# Patient Record
Sex: Female | Born: 2000 | Race: Black or African American | Hispanic: No | Marital: Single | Smoking: Never smoker
Health system: Southern US, Community
[De-identification: ages and names within clinical notes are randomized; demographics above are authoritative.]

## PROBLEM LIST (undated history)

## (undated) ENCOUNTER — Inpatient Hospital Stay (HOSPITAL_COMMUNITY): Payer: Self-pay

## (undated) DIAGNOSIS — Z789 Other specified health status: Secondary | ICD-10-CM

## (undated) DIAGNOSIS — Z202 Contact with and (suspected) exposure to infections with a predominantly sexual mode of transmission: Secondary | ICD-10-CM

## (undated) HISTORY — PX: NO PAST SURGERIES: SHX2092

---
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
## 2019-05-16 ENCOUNTER — Encounter: Payer: Self-pay | Admitting: Advanced Practice Midwife

## 2019-05-16 ENCOUNTER — Other Ambulatory Visit: Payer: Self-pay

## 2019-05-16 ENCOUNTER — Ambulatory Visit (INDEPENDENT_AMBULATORY_CARE_PROVIDER_SITE_OTHER): Payer: 59 | Admitting: Advanced Practice Midwife

## 2019-05-16 VITALS — BP 131/76 | HR 80 | Wt 156.2 lb

## 2019-05-16 DIAGNOSIS — N912 Amenorrhea, unspecified: Secondary | ICD-10-CM | POA: Diagnosis not present

## 2019-05-16 DIAGNOSIS — Z3202 Encounter for pregnancy test, result negative: Secondary | ICD-10-CM | POA: Diagnosis not present

## 2019-05-16 DIAGNOSIS — N911 Secondary amenorrhea: Secondary | ICD-10-CM

## 2019-05-16 LAB — POCT URINE PREGNANCY: Preg Test, Ur: NEGATIVE

## 2019-05-16 NOTE — Progress Notes (Signed)
  GYNECOLOGY PROGRESS NOTE  History:  18 y.o. G2P1002 presents to [facility] office today for problem gyn visit. She reports no menses since 03/28/19.  Menses resumed 4 months ago following vaginal delivery 04/23/2018.  Since return of menses, they were regular, every 28 days until this month.  She took a home pregnancy test that was negative.  She denies pain.  She denies h/a, dizziness, shortness of breath, n/v, or fever/chills.    The following portions of the patient's history were reviewed and updated as appropriate: allergies, current medications, past family history, past medical history, past social history, past surgical history and problem list.  Review of Systems:  Pertinent items are noted in HPI.   Objective:  Physical Exam Blood pressure 131/76, pulse 80, weight 156 lb 3.2 oz (70.9 kg), last menstrual period 03/28/2019. VS reviewed, nursing note reviewed,  Constitutional: well developed, well nourished, no distress HEENT: normocephalic CV: normal rate Pulm/chest wall: normal effort Breast Exam: deferred Abdomen: soft Neuro: alert and oriented x 3 Skin: warm, dry Psych: affect normal Pelvic exam: Deferred  Assessment & Plan:  1. Amenorrhea --Pt with menses 2-3 weeks late.  Prior to this, menses were regular, every 28 days since February when they resumed after childbirth.  --There is no associated abdominal or pelvic pain.  - POCT urine pregnancy.  UPT negative. Pt also reports negative home UPT.  --Discussed options with pt.  One irregular menstrual cycle is likely normal, physiologic, maybe an anovulatory cycle.  Follow up appointment made in 2 months so if period does not return, further work up is needed.  --Pt is sexually active.  Discussed pregnancy prevention if pt does not desire pregnancy.  Discussed LARCs as most effective forms of birth control.  Discussed benefits/risks of other methods.  Pt to take printed materials today and consider options.        Fatima Blank, CNM 5:14 PM

## 2019-05-16 NOTE — Progress Notes (Signed)
Pt is here for amenorrhea. Pt states she has not had a period since 03/28/19. UPT today is negative.

## 2019-07-03 ENCOUNTER — Other Ambulatory Visit: Payer: Self-pay | Admitting: Physician Assistant

## 2019-07-17 ENCOUNTER — Ambulatory Visit: Payer: 59 | Admitting: Advanced Practice Midwife

## 2019-08-17 ENCOUNTER — Other Ambulatory Visit: Payer: Self-pay

## 2019-08-17 ENCOUNTER — Encounter: Payer: Self-pay | Admitting: *Deleted

## 2019-08-17 ENCOUNTER — Encounter: Payer: Self-pay | Admitting: Physician Assistant

## 2019-08-17 ENCOUNTER — Ambulatory Visit (INDEPENDENT_AMBULATORY_CARE_PROVIDER_SITE_OTHER): Payer: 59 | Admitting: Physician Assistant

## 2019-08-17 VITALS — BP 120/80 | HR 70

## 2019-08-17 DIAGNOSIS — G43109 Migraine with aura, not intractable, without status migrainosus: Secondary | ICD-10-CM

## 2019-08-17 MED ORDER — IBUPROFEN 600 MG PO TABS: 600.0000 mg | ORAL_TABLET | Freq: Four times a day (QID) | ORAL | 0 refills | Status: AC | PRN

## 2019-08-17 MED ORDER — AJOVY 225 MG/1.5ML ~~LOC~~ SOAJ: 675.0000 mg | SUBCUTANEOUS | 3 refills | Status: DC

## 2019-08-17 NOTE — Progress Notes (Signed)
History:  Barbara Long is a 18 y.o. G2P1002 who presents to clinic today for headache.  She is still using Emgality but notes that it doesn't work as well the last 2 weeks of the month.   She wants to try something different.  She feels the headaches are still pretty much the same.  She does note they are less severe.  She has more days with headahe than without.  She has been without emgality for the last month.   Excedrin and ibuprofen are helpful.  Sumatriptan was not helpful.   HIT6:54 Number of days in the last 4 weeks with:  Severe headache: 2 Moderate headache: 4 Mild headache: 12  No headache: 10   Past Medical History:  Diagnosis Date  . Chlamydia contact, treated   . Medical history non-contributory     Social History   Socioeconomic History  . Marital status: Single    Spouse name: Not on file  . Number of children: Not on file  . Years of education: Not on file  . Highest education level: Not on file  Occupational History  . Not on file  Social Needs  . Financial resource strain: Not on file  . Food insecurity    Worry: Not on file    Inability: Not on file  . Transportation needs    Medical: Not on file    Non-medical: Not on file  Tobacco Use  . Smoking status: Never Smoker  . Smokeless tobacco: Never Used  Substance and Sexual Activity  . Alcohol use: No  . Drug use: No  . Sexual activity: Yes    Birth control/protection: None  Lifestyle  . Physical activity    Days per week: Not on file    Minutes per session: Not on file  . Stress: Not on file  Relationships  . Social Herbalist on phone: Not on file    Gets together: Not on file    Attends religious service: Not on file    Active member of club or organization: Not on file    Attends meetings of clubs or organizations: Not on file    Relationship status: Not on file  . Intimate partner violence    Fear of current or ex partner: Not on file    Emotionally abused: Not on file   Physically abused: Not on file    Forced sexual activity: Not on file  Other Topics Concern  . Not on file  Social History Narrative  . Not on file    Family History  Problem Relation Age of Onset  . Diabetes Maternal Grandmother   . Hypertension Maternal Grandmother   . Hypertension Mother   . Hypertension Maternal Grandfather   . Diabetes Paternal Grandmother     No Known Allergies  Current Outpatient Medications on File Prior to Visit  Medication Sig Dispense Refill  . Galcanezumab-gnlm (EMGALITY) 120 MG/ML SOAJ Inject 240 mg into the skin as directed AND 120 mg every 30 (thirty) days. Inj 240mg  once then 120mg  monthly. 1 pen 11   No current facility-administered medications on file prior to visit.      Review of Systems:  All pertinent positive/negative included in HPI, all other review of systems are negative   Objective:  Physical Exam BP 120/80   Pulse 70  CONSTITUTIONAL: Well-developed, well-nourished female in no acute distress.  EYES: EOM intact ENT: Normocephalic CARDIOVASCULAR: Regular rate RESPIRATORY: Normal rate.  MUSCULOSKELETAL: Normal ROM SKIN: Warm, dry  without erythema  NEUROLOGICAL: Alert, oriented, CN II-XII grossly intact, Appropriate balance PSYCH: Normal behavior, mood   Assessment & Plan:  Assessment: Migraine - worsened  Plan: Replace Emgality with Ajovy.  She will try 3 ajovy every 3 months.  Sample and savings card provided.  Ibuprofen for migraines that breakthrough I have also given a sample of Ubrelvy.  If she would like a prescription, she will let me know.   Follow-up in 12 months or sooner PRN  Paticia Stack, PA-C 08/17/2019 9:25 AM

## 2019-11-06 ENCOUNTER — Ambulatory Visit: Payer: 59 | Attending: Internal Medicine

## 2019-11-19 ENCOUNTER — Encounter: Payer: Self-pay | Admitting: *Deleted

## 2020-01-15 ENCOUNTER — Encounter: Payer: Self-pay | Admitting: Advanced Practice Midwife

## 2020-01-15 ENCOUNTER — Telehealth (INDEPENDENT_AMBULATORY_CARE_PROVIDER_SITE_OTHER): Payer: 59 | Admitting: Advanced Practice Midwife

## 2020-01-15 DIAGNOSIS — Z3009 Encounter for other general counseling and advice on contraception: Secondary | ICD-10-CM

## 2020-01-15 MED ORDER — MEDROXYPROGESTERONE ACETATE 150 MG/ML IM SUSP: 150.0000 mg | INTRAMUSCULAR | 4 refills | Status: AC

## 2020-01-15 NOTE — Progress Notes (Signed)
    TELEHEALTH GYNECOLOGY VIRTUAL VIDEO VISIT ENCOUNTER NOTE  Provider location: [facility] for [facility] at [place]   I connected with Barbara Long on 01/15/20 at  4:00 PM EDT by MyChart Video Encounter at home and verified that I am speaking with the correct person using two identifiers.   I discussed the limitations, risks, security and privacy concerns of performing an evaluation and management service virtually and the availability of in person appointments. I also discussed with the patient that there may be a patient responsible charge related to this service. The patient expressed understanding and agreed to proceed.   History:  Barbara Long is a 19 y.o. G66P1011 female being evaluated today for contraceptive counseling. She recently had [facility] with [facility] in [facility] and wants to start birth control. She used Depo in the past and wants to start this again.  She denies any gyn problems or concerns.  She denies any abnormal vaginal discharge, bleeding, pelvic pain or other concerns.       Past Medical History:  Diagnosis Date  . Chlamydia contact, treated   . Medical history non-contributory    Past Surgical History:  Procedure Laterality Date  . NO PAST SURGERIES     The following portions of the patient's history were reviewed and updated as appropriate: allergies, current medications, past family history, past medical history, past social history, past surgical history and problem list.   Health Maintenance:  No Pap hx due to young age  Review of Systems:  Pertinent items noted in HPI and remainder of comprehensive ROS otherwise negative.  Physical Exam:   General:  Alert, oriented and cooperative. Patient appears to be in no acute distress.  Mental Status: Normal mood and affect. Normal behavior. Normal judgment and thought content.   Respiratory: Normal respiratory effort, no problems with respiration noted  Rest of physical exam deferred due to  type of encounter  Labs and Imaging No results found for this or any previous visit (from the past 336 hour(s)). No results found.     Assessment and Plan:     1. Encounter for counseling regarding contraception Discussed LARCs as most effective forms of birth control.  Discussed benefits/risks of other methods.  Pt desires Depo but will consider LARC in the future.  Depo ordered as prescription, pt to follow up this week for injection. --Last intercourse 2+ weeks ago, pt encouraged to use abstinence or condoms until her Depo injection is given and pregnancy test will need to be negative at her injection visit. Pt states understanding.  - medroxyPROGESTERone (DEPO-PROVERA) 150 MG/ML injection; Inject 1 mL (150 mg total) into the muscle every 3 (three) months.  Dispense: 1 mL; Refill: 4       I discussed the assessment and treatment plan with the patient. The patient was provided an opportunity to ask questions and all were answered. The patient agreed with the plan and demonstrated an understanding of the instructions.   The patient was advised to call back or seek an in-person evaluation/go to the ED if the symptoms worsen or if the condition fails to improve as anticipated.  I provided 5 minutes of face-to-face time during this encounter.   Fatima Blank, [place] for [facility], [place]

## 2020-01-15 NOTE — Progress Notes (Signed)
Virtual Visit via Telephone Note  I connected with Barbara Long on 01/15/20 at  4:00 PM EDT by telephone and verified that I am speaking with the correct person using two identifiers.  Pt requests to start Depo. Denies unprotected IC x 14 days

## 2020-01-17 ENCOUNTER — Other Ambulatory Visit: Payer: Self-pay

## 2020-01-17 ENCOUNTER — Ambulatory Visit (INDEPENDENT_AMBULATORY_CARE_PROVIDER_SITE_OTHER): Payer: 59

## 2020-01-17 DIAGNOSIS — Z3042 Encounter for surveillance of injectable contraceptive: Secondary | ICD-10-CM

## 2020-01-17 DIAGNOSIS — Z3202 Encounter for pregnancy test, result negative: Secondary | ICD-10-CM | POA: Diagnosis not present

## 2020-01-17 LAB — POCT URINE PREGNANCY: Preg Test, Ur: NEGATIVE

## 2020-01-17 MED ORDER — MEDROXYPROGESTERONE ACETATE 150 MG/ML IM SUSP
150.0000 mg | Freq: Once | INTRAMUSCULAR | Status: AC
  Administered 2020-01-17: 150 mg via INTRAMUSCULAR

## 2020-01-17 NOTE — Progress Notes (Signed)
Pt presents for pregnancy test and depo inj. Patient states that she had an abortion 2/9, but still had a faint positive on 3/9. Test today is negative. Per Lattie Haw okay to give depo today with negative test. Patient states that she has not had IC since 2 weeks prior to 2/9. [facility] and was told that there was nothing left in her uterus.  Depo inj given in RUOQ. Patient tolerated well. Next depo due 6/3-6/17.

## 2020-04-09 ENCOUNTER — Other Ambulatory Visit: Payer: Self-pay

## 2020-04-09 ENCOUNTER — Ambulatory Visit: Payer: 59

## 2020-04-09 MED ORDER — MEDROXYPROGESTERONE ACETATE 150 MG/ML IM SUSP: 150.0000 mg | Freq: Once | INTRAMUSCULAR | Status: DC

## 2020-04-09 NOTE — Progress Notes (Unsigned)
Barbara Long did not have depo. Appointment rescheduled. -EH/RMA

## 2020-04-16 ENCOUNTER — Ambulatory Visit (INDEPENDENT_AMBULATORY_CARE_PROVIDER_SITE_OTHER): Payer: 59

## 2020-04-16 ENCOUNTER — Other Ambulatory Visit: Payer: Self-pay

## 2020-04-16 VITALS — BP 124/69 | HR 88 | Wt 144.7 lb

## 2020-04-16 DIAGNOSIS — Z3042 Encounter for surveillance of injectable contraceptive: Secondary | ICD-10-CM | POA: Diagnosis not present

## 2020-04-16 MED ORDER — MEDROXYPROGESTERONE ACETATE 150 MG/ML IM SUSP
150.0000 mg | Freq: Once | INTRAMUSCULAR | Status: AC
  Administered 2020-04-16: 150 mg via INTRAMUSCULAR

## 2020-04-16 NOTE — Progress Notes (Signed)
GYN presents for DEPO, given in [place], tolerated well.  Next DEPO Sept.01-15, 2021  Administrations This Visit    medroxyPROGESTERone (DEPO-PROVERA) injection 150 mg    Admin Date 04/16/2020 Action Given Dose 150 mg Route Intramuscular Administered By Tamela Oddi, RMA

## 2020-05-21 NOTE — Progress Notes (Signed)
Patient ID: Barbara Long, female   DOB: 2001/10/19, 20 y.o.   MRN: [id number] Patient was assessed and managed by nursing staff during this encounter. I have reviewed the chart and agree with the documentation and plan. I have also made any necessary editorial changes.  Emeterio Reeve, MD 05/21/2020 1:12 PM

## 2020-06-02 ENCOUNTER — Other Ambulatory Visit: Payer: Self-pay | Admitting: Physician Assistant

## 2020-07-08 ENCOUNTER — Ambulatory Visit: Payer: 59

## 2020-07-10 ENCOUNTER — Telehealth: Payer: Self-pay | Admitting: Radiology

## 2020-07-10 NOTE — Telephone Encounter (Signed)
Left message on voicemail for patient to call [id number] to schedule a f/u headache appointment with [name], PA in October.

## 2020-07-11 ENCOUNTER — Other Ambulatory Visit: Payer: Self-pay

## 2020-07-11 ENCOUNTER — Ambulatory Visit (INDEPENDENT_AMBULATORY_CARE_PROVIDER_SITE_OTHER): Payer: 59

## 2020-07-11 ENCOUNTER — Encounter: Payer: Self-pay | Admitting: Radiology

## 2020-07-11 VITALS — BP 123/73 | HR 94 | Wt 154.0 lb

## 2020-07-11 DIAGNOSIS — Z3042 Encounter for surveillance of injectable contraceptive: Secondary | ICD-10-CM | POA: Diagnosis not present

## 2020-07-11 MED ORDER — MEDROXYPROGESTERONE ACETATE 150 MG/ML IM SUSP
150.0000 mg | Freq: Once | INTRAMUSCULAR | Status: AC
  Administered 2020-07-11: 150 mg via INTRAMUSCULAR

## 2020-07-11 NOTE — Progress Notes (Signed)
GYN presents for DEPO Injection, given in RUOQ, tolerated well.  Next DEPO Nov. 26 Dec. 10, 2021  Administrations This Visit    medroxyPROGESTERone (DEPO-PROVERA) injection 150 mg    Admin Date 07/11/2020 Action Given Dose 150 mg Route Intramuscular Administered By Tamela Oddi, RMA

## 2020-07-14 NOTE — Progress Notes (Signed)
I have reviewed the chart and agree with the nurse's note and plan of care for this visit.   Fatima Blank, CNM 9:23 AM

## 2020-08-22 ENCOUNTER — Ambulatory Visit (INDEPENDENT_AMBULATORY_CARE_PROVIDER_SITE_OTHER): Payer: 59 | Admitting: Physician Assistant

## 2020-08-22 ENCOUNTER — Other Ambulatory Visit: Payer: Self-pay

## 2020-08-22 ENCOUNTER — Encounter: Payer: Self-pay | Admitting: Physician Assistant

## 2020-08-22 VITALS — BP 120/77 | HR 71 | Ht 65.0 in | Wt 145.8 lb

## 2020-08-22 DIAGNOSIS — G43009 Migraine without aura, not intractable, without status migrainosus: Secondary | ICD-10-CM

## 2020-08-22 MED ORDER — UBRELVY 100 MG PO TABS: 100.0000 mg | ORAL_TABLET | ORAL | 11 refills | Status: AC | PRN

## 2020-08-22 MED ORDER — AJOVY 225 MG/1.5ML ~~LOC~~ SOAJ: 675.0000 mg | SUBCUTANEOUS | 3 refills | Status: AC

## 2020-08-22 NOTE — Progress Notes (Signed)
History:  Barbara Long is a 19 y.o. G2P1011 who presents to clinic today for yearly headache eval. She notes the ajovy has been helpful for prevention of migraine and wants to continue.  She did not initially remember what happened with her use of ubrelvy but later seemed to believe it worked well. She is on a good routine now being in school.  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  She has 2 kids, ages 29 and 19, that she takes to daycare.  She has another job on the weekends for 8 hours during each day.  She works as a Quarry manager and hopes to be admitted to the [facility] program [facility] in February.  She does not exercise.    HIT6:54 Number of days in the last 4 weeks with:  Severe headache: 3 Moderate headache: 11 Mild headache: 6  No headache: 8   Past Medical History:  Diagnosis Date  . Chlamydia contact, treated   . Medical history non-contributory     Social History   Socioeconomic History  . Marital status: Single    Spouse name: Not on file  . Number of children: Not on file  . Years of education: Not on file  . Highest education level: Not on file  Occupational History  . Not on file  Tobacco Use  . Smoking status: Never Smoker  . Smokeless tobacco: Never Used  Substance and Sexual Activity  . Alcohol use: No  . Drug use: No  . Sexual activity: Yes    Birth control/protection: None  Other Topics Concern  . Not on file  Social History Narrative  . Not on file   Social Determinants of Health   Financial Resource Strain:   . Difficulty of Paying Living Expenses: Not on file  Food Insecurity:   . Worried About Charity fundraiser in the Last Year: Not on file  . Ran Out of Food in the Last Year: Not on file  Transportation Needs:   . Lack of Transportation (Medical): Not on file  . Lack of Transportation (Non-Medical): Not on file  Physical Activity:   . Days of Exercise per Week: Not on file  . Minutes of Exercise per Session:  Not on file  Stress:   . Feeling of Stress : Not on file  Social Connections:   . Frequency of Communication with Friends and Family: Not on file  . Frequency of Social Gatherings with Friends and Family: Not on file  . Attends Religious Services: Not on file  . Active Member of Clubs or Organizations: Not on file  . Attends Archivist Meetings: Not on file  . Marital Status: Not on file  Intimate Partner Violence:   . Fear of Current or Ex-Partner: Not on file  . Emotionally Abused: Not on file  . Physically Abused: Not on file  . Sexually Abused: Not on file    Family History  Problem Relation Age of Onset  . Diabetes Maternal Grandmother   . Hypertension Maternal Grandmother   . Hypertension Mother   . Hypertension Maternal Grandfather   . Diabetes Paternal Grandmother     No Known Allergies  Current Outpatient Medications on File Prior to Visit  Medication Sig Dispense Refill  . AJOVY 225 MG/1.5ML SOAJ INJECT 675MG  INTO THE SKIN EVERY 3 MONTHS 1.5 mL 2  . ibuprofen (ADVIL) 600 MG tablet Take 1 tablet (600 mg total) by mouth  every 6 (six) hours as needed. 30 tablet 0  . medroxyPROGESTERone (DEPO-PROVERA) 150 MG/ML injection Inject 1 mL (150 mg total) into the muscle every 3 (three) months. 1 mL 4   No current facility-administered medications on file prior to visit.     Review of Systems:  All pertinent positive/negative included in HPI, all other review of systems are negative   Objective:  Physical Exam BP 120/77   Pulse 71   Ht 5\' 5"  (1.651 m)   Wt 145 lb 12.8 oz (66.1 kg)   BMI 24.26 kg/m  CONSTITUTIONAL: Well-developed, well-nourished female in no acute distress.  EYES: EOM intact ENT: Normocephalic CARDIOVASCULAR: Regular rate  RESPIRATORY: Normal rate. MUSCULOSKELETAL: Normal ROM SKIN: Warm, dry without erythema  NEUROLOGICAL: Alert, oriented, CN II-XII grossly intact, Appropriate balance PSYCH: Normal behavior, mood   Assessment & Plan:   Assessment: 1. Migraine without aura and without status migrainosus, not intractable    worsened  Plan: Pt advised likely migraines are worse due to her crazy schedule.  She sleeps at varying times for varying amounts of time as she works overnight and has 2 children.  She is encouraged to consider her schedule and arrange more regular sleep as able.  Will continue ajovy for prevention.  She prefers to use 3 injections every 3 months.  I have prescribed it this way for her.   Roselyn Meier for acute migraine.  Hydration encouraged! Eat at least 3 times per day or more often as able.  Exercise whenever possible.  Use this as a means of more energy and limit caffeine consumption.  Follow-up in 12 months or sooner PRN  Paticia Stack, PA-C 08/22/2020 10:06 AM

## 2020-08-22 NOTE — Patient Instructions (Signed)

## 2020-09-08 ENCOUNTER — Encounter: Payer: Self-pay | Admitting: *Deleted

## 2020-09-17 ENCOUNTER — Encounter: Payer: Self-pay | Admitting: *Deleted

## 2020-09-30 ENCOUNTER — Ambulatory Visit: Payer: 59

## 2020-10-28 ENCOUNTER — Ambulatory Visit: Payer: Medicaid Other

## 2020-10-28 ENCOUNTER — Other Ambulatory Visit: Payer: Medicaid Other

## 2020-10-28 DIAGNOSIS — Z20822 Contact with and (suspected) exposure to covid-19: Secondary | ICD-10-CM | POA: Diagnosis not present

## 2020-10-29 LAB — SARS-COV-2, NAA 2 DAY TAT

## 2020-10-29 LAB — SPECIMEN STATUS REPORT

## 2020-10-29 LAB — NOVEL CORONAVIRUS, NAA: SARS-CoV-2, NAA: DETECTED — AB

## 2020-11-20 ENCOUNTER — Ambulatory Visit: Payer: Medicaid Other

## 2020-11-21 ENCOUNTER — Other Ambulatory Visit: Payer: Self-pay

## 2020-11-21 ENCOUNTER — Other Ambulatory Visit (HOSPITAL_COMMUNITY)
Admission: RE | Admit: 2020-11-21 | Discharge: 2020-11-21 | Disposition: A | Discharge status: Home / Self Care | Payer: Medicaid Other | Source: Ambulatory Visit | Attending: Obstetrics | Admitting: Obstetrics

## 2020-11-21 ENCOUNTER — Ambulatory Visit: Payer: Medicaid Other

## 2020-11-21 DIAGNOSIS — N898 Other specified noninflammatory disorders of vagina: Secondary | ICD-10-CM

## 2020-11-21 NOTE — Progress Notes (Signed)
Pt is in the office for std testing, reports vaginal discharge

## 2020-11-24 LAB — CERVICOVAGINAL ANCILLARY ONLY
Bacterial Vaginitis (gardnerella): NEGATIVE
Candida Glabrata: NEGATIVE
Candida Vaginitis: POSITIVE — AB
Chlamydia: NEGATIVE
Comment: NEGATIVE
Comment: NEGATIVE
Comment: NEGATIVE
Comment: NEGATIVE
Comment: NEGATIVE
Comment: NORMAL
Neisseria Gonorrhea: NEGATIVE
Trichomonas: NEGATIVE

## 2020-11-28 ENCOUNTER — Other Ambulatory Visit: Payer: Self-pay | Admitting: Obstetrics & Gynecology

## 2020-11-28 MED ORDER — FLUCONAZOLE 150 MG PO TABS: 150.0000 mg | ORAL_TABLET | Freq: Once | ORAL | 0 refills | Status: DC

## 2020-11-28 NOTE — Addendum Note (Signed)
Addended by: Woodroe Mode on: 11/28/2020 02:09 PM   Modules accepted: Orders

## 2020-12-11 ENCOUNTER — Telehealth (INDEPENDENT_AMBULATORY_CARE_PROVIDER_SITE_OTHER): Payer: Medicaid Other | Admitting: Internal Medicine

## 2020-12-17 NOTE — Progress Notes (Signed)
Virtual encounter not able to be completed---patient did not answer phone call.   Phill Myron, D.O. Primary Care at [facility]  12/17/2020, 12:10 PM

## 2021-01-13 ENCOUNTER — Ambulatory Visit (INDEPENDENT_AMBULATORY_CARE_PROVIDER_SITE_OTHER): Payer: Medicaid Other | Admitting: Obstetrics and Gynecology

## 2021-01-13 ENCOUNTER — Other Ambulatory Visit: Payer: Self-pay

## 2021-01-13 ENCOUNTER — Other Ambulatory Visit (HOSPITAL_COMMUNITY)
Admission: RE | Admit: 2021-01-13 | Discharge: 2021-01-13 | Disposition: A | Discharge status: Home / Self Care | Payer: BC Managed Care – PPO | Source: Ambulatory Visit | Attending: Obstetrics and Gynecology | Admitting: Obstetrics and Gynecology

## 2021-01-13 ENCOUNTER — Encounter: Payer: Self-pay | Admitting: Obstetrics and Gynecology

## 2021-01-13 VITALS — BP 117/73 | HR 75 | Ht 65.0 in | Wt 157.0 lb

## 2021-01-13 DIAGNOSIS — N898 Other specified noninflammatory disorders of vagina: Secondary | ICD-10-CM | POA: Diagnosis not present

## 2021-01-13 DIAGNOSIS — Z113 Encounter for screening for infections with a predominantly sexual mode of transmission: Secondary | ICD-10-CM | POA: Insufficient documentation

## 2021-01-13 DIAGNOSIS — B373 Candidiasis of vulva and vagina: Secondary | ICD-10-CM | POA: Diagnosis not present

## 2021-01-13 MED ORDER — FLUCONAZOLE 150 MG PO TABS: 150.0000 mg | ORAL_TABLET | Freq: Once | ORAL | 1 refills | Status: AC

## 2021-01-13 NOTE — Progress Notes (Signed)
   History:  Ms. Karen Huhta is a 20 y.o. G2P1011 who presents to clinic today for vaginal discharge.    Reports that she had similar issue in January and was treated for yeast infection with subsequent improvement. Reports several days of vaginal discharge, itching, irritation. No abnormal odor. No new sexual partners. Denies douching. No pelvic pain or fevers.   Also reports that she is on depo and does not have period and wondering if there is way to get period again. Otherwise happy with depo. Had IUD in past and it fell out.   The following portions of the patient's history were reviewed and updated as appropriate: allergies, current medications, family history, past medical history, social history, past surgical history and problem list.  Review of Systems:  Review of Systems  Constitutional: Negative for chills and fever.  Genitourinary: Negative for dysuria, flank pain, frequency and urgency.  Musculoskeletal: Negative for myalgias.      Objective:  Physical Exam There were no vitals taken for this visit. Physical Exam Vitals reviewed.  Constitutional:      Appearance: Normal appearance.  Pulmonary:     Effort: Pulmonary effort is normal.  Abdominal:     General: Bowel sounds are normal.  Genitourinary:    General: Normal vulva.     Vagina: Vaginal discharge present.     Comments: White discharge, no foul odor  Neurological:     Mental Status: She is alert.       Labs and Imaging No results found for this or any previous visit (from the past 24 hour(s)).  No results found.   Assessment & Plan:   Vaginal Discharge -cervicovag swab obtained -will treat w diflucan, discussed return precautions w patient, discussed other preventative measures    Janet Berlin, MD 01/13/2021 8:31 AM    Sharene Skeans, MD [facility] Family Medicine Fellow, [facility] for [facility], [place]

## 2021-01-13 NOTE — Progress Notes (Signed)
Pt presents today for evaluation of vaginal d/c that is milky colored with itching. Denies any burning or odor. States this has been going on for about a week. Denies any urinary symptoms.

## 2021-01-14 ENCOUNTER — Ambulatory Visit
Admission: EM | Admit: 2021-01-14 | Discharge: 2021-01-14 | Disposition: A | Discharge status: Home / Self Care | Payer: BC Managed Care – PPO | Attending: Family Medicine | Admitting: Family Medicine

## 2021-01-14 ENCOUNTER — Other Ambulatory Visit: Payer: Self-pay

## 2021-01-14 DIAGNOSIS — J069 Acute upper respiratory infection, unspecified: Secondary | ICD-10-CM

## 2021-01-14 LAB — CERVICOVAGINAL ANCILLARY ONLY
Bacterial Vaginitis (gardnerella): NEGATIVE
Candida Glabrata: NEGATIVE
Candida Vaginitis: POSITIVE — AB
Chlamydia: NEGATIVE
Comment: NEGATIVE
Comment: NEGATIVE
Comment: NEGATIVE
Comment: NEGATIVE
Comment: NEGATIVE
Comment: NORMAL
Neisseria Gonorrhea: NEGATIVE
Trichomonas: NEGATIVE

## 2021-01-14 MED ORDER — PROMETHAZINE-DM 6.25-15 MG/5ML PO SYRP: 5.0000 mL | ORAL_SOLUTION | Freq: Four times a day (QID) | ORAL | 0 refills | Status: AC | PRN

## 2021-01-14 MED ORDER — FLUTICASONE PROPIONATE 50 MCG/ACT NA SUSP: 1.0000 | Freq: Two times a day (BID) | NASAL | 0 refills | Status: AC

## 2021-01-14 NOTE — ED Provider Notes (Signed)
[facility]    CSN: [id number] Arrival date & time: 01/14/21  0017      History   Chief Complaint Chief Complaint  Patient presents with  . Sore Throat  . Cough    HPI Barbara Long is a 20 y.o. female.   Patient presenting today with 6-day history of sore throat, cough, congestion, headache.  She denies fever, chills, body aches, chest pain, shortness of breath, abdominal pain, nausea vomiting diarrhea.  She is taking TheraFlu with mild temporary relief.  She states both of her children are sick with similar symptoms.  No known urgent chronic medical problems.       Past Medical History:  Diagnosis Date  . Chlamydia contact, treated   . Medical history non-contributory     Patient Active Problem List   Diagnosis Date Noted  . Migraine without aura and without status migrainosus, not intractable 08/22/2020  . 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 NO PAST SURGERIES      OB History    Gravida  2   Para  1   Term  1   Preterm  0   AB  1   Living  1     SAB  0   IAB  1   Ectopic  0   Multiple  0   Live Births  1            Home Medications    Prior to Admission medications   Medication Sig Start Date End Date Taking? Authorizing Provider  fluticasone (FLONASE) 50 MCG/ACT nasal spray Place 1 spray into both nostrils in the morning and at bedtime. 01/14/21  Yes Volney American, PA-C  promethazine-dextromethorphan (PROMETHAZINE-DM) 6.25-15 MG/5ML syrup Take 5 mLs by mouth 4 (four) times daily as needed for cough. 01/14/21  Yes Volney American, PA-C  Fremanezumab-vfrm (AJOVY) 225 MG/1.5ML SOAJ Inject 675 mg into the skin every 3 (three) months. 08/22/20   Jaclyn Prime, Collene Leyden, PA-C  ibuprofen (ADVIL) 600 MG tablet Take 1 tablet (600 mg total) by mouth every 6 (six) hours as needed. 08/17/19   Jaclyn Prime, Collene Leyden, PA-C  medroxyPROGESTERone (DEPO-PROVERA) 150 MG/ML  injection Inject 1 mL (150 mg total) into the muscle every 3 (three) months. 01/15/20   Leftwich-Kirby, Kathie Dike, CNM  Ubrogepant (UBRELVY) 100 MG TABS Take 100 mg by mouth as needed. 08/22/20   Paticia Stack, PA-C    Family History Family History  Problem Relation Age of Onset  . Diabetes Maternal Grandmother   . Hypertension Maternal Grandmother   . Hypertension Mother   . Hypertension Maternal Grandfather   . Diabetes Paternal Grandmother     Social History Social History   Tobacco Use  . Smoking status: Never Smoker  . Smokeless tobacco: Never Used  Substance Use Topics  . Alcohol use: No  . Drug use: No     Allergies   Patient has no known allergies.   Review of Systems Review of Systems HPI  Physical Exam Triage Vital Signs ED Triage Vitals  Enc Vitals Group     BP 01/14/21 1003 117/67     Pulse Rate 01/14/21 1003 69     Resp 01/14/21 1003 16     Temp 01/14/21 1003 97.8 F (36.6 C)     Temp Source 01/14/21 1003 Oral     SpO2 01/14/21 1003 98 %     Weight --  Height --      Head Circumference --      Peak Flow --      Pain Score 01/14/21 1006 0     Pain Loc --      Pain Edu? --      Excl. in [place]? --    No data found.  Updated Vital Signs BP 117/67 (BP Location: Left Arm)   Pulse 69   Temp 97.8 F (36.6 C) (Oral)   Resp 16   LMP  (LMP Unknown)   SpO2 98%   Visual Acuity Right Eye Distance:   Left Eye Distance:   Bilateral Distance:    Right Eye Near:   Left Eye Near:    Bilateral Near:     Physical Exam Vitals and nursing note reviewed.  Constitutional:      Appearance: Normal appearance. She is not ill-appearing.  HENT:     Head: Atraumatic.     Right Ear: Tympanic membrane normal.     Left Ear: Tympanic membrane normal.     Nose: Rhinorrhea present.     Mouth/Throat:     Mouth: Mucous membranes are moist.     Pharynx: Posterior oropharyngeal erythema present. No oropharyngeal exudate.  Eyes:     Extraocular  Movements: Extraocular movements intact.     Conjunctiva/sclera: Conjunctivae normal.  Cardiovascular:     Rate and Rhythm: Normal rate and regular rhythm.     Heart sounds: Normal heart sounds.  Pulmonary:     Effort: Pulmonary effort is normal. No respiratory distress.     Breath sounds: Normal breath sounds. No wheezing or rales.  Musculoskeletal:        General: Normal range of motion.     Cervical back: Normal range of motion and neck supple.  Skin:    General: Skin is warm and dry.  Neurological:     Mental Status: She is alert and oriented to person, place, and time.  Psychiatric:        Mood and Affect: Mood normal.        Thought Content: Thought content normal.        Judgment: Judgment normal.      UC Treatments / Results  Labs (all labs ordered are listed, but only abnormal results are displayed) Labs Reviewed - No data to display  EKG   Radiology No results found.  Procedures Procedures (including critical care time)  Medications Ordered in UC Medications - No data to display  Initial Impression / Assessment and Plan / UC Course  I have reviewed the triage vital signs and the nursing notes.  Pertinent labs & imaging results that were available during my care of the patient were reviewed by me and considered in my medical decision making (see chart for details).     Vitals and exam reassuring today, declines Covid or flu testing today.  We will treat with Phenergan DM cough syrup, Flonase nasal spray, other over-the-counter supportive care.  Return precautions reviewed.  Quarantine until symptoms resolve.  Final Clinical Impressions(s) / UC Diagnoses   Final diagnoses:  Viral URI with cough   Discharge Instructions   None    ED Prescriptions    Medication Sig Dispense Auth. Provider   promethazine-dextromethorphan (PROMETHAZINE-DM) 6.25-15 MG/5ML syrup Take 5 mLs by mouth 4 (four) times daily as needed for cough. 100 mL Volney American,  PA-C   fluticasone [facility]) 50 MCG/ACT nasal spray Place 1 spray into both nostrils in the morning and at  bedtime. 16 g Volney American, [place]     PDMP not reviewed this encounter.   Volney American, [place] 01/14/21 1901

## 2021-01-14 NOTE — ED Triage Notes (Signed)
Patient presents to Urgent Care with complaints of sore throat and cough since Friday. Treating symptoms with Theraflu.  Denies fever.

## 2021-09-02 ENCOUNTER — Encounter: Payer: Self-pay | Admitting: *Deleted

## 2021-11-20 DIAGNOSIS — Z124 Encounter for screening for malignant neoplasm of cervix: Secondary | ICD-10-CM | POA: Diagnosis not present

## 2021-11-20 DIAGNOSIS — L309 Dermatitis, unspecified: Secondary | ICD-10-CM | POA: Diagnosis not present

## 2021-11-20 DIAGNOSIS — Z202 Contact with and (suspected) exposure to infections with a predominantly sexual mode of transmission: Secondary | ICD-10-CM | POA: Diagnosis not present

## 2021-11-20 DIAGNOSIS — N898 Other specified noninflammatory disorders of vagina: Secondary | ICD-10-CM | POA: Diagnosis not present

## 2021-12-11 DIAGNOSIS — Z3490 Encounter for supervision of normal pregnancy, unspecified, unspecified trimester: Secondary | ICD-10-CM | POA: Diagnosis not present

## 2021-12-11 DIAGNOSIS — R87610 Atypical squamous cells of undetermined significance on cytologic smear of cervix (ASC-US): Secondary | ICD-10-CM | POA: Diagnosis not present

## 2021-12-23 DIAGNOSIS — Z3A15 15 weeks gestation of pregnancy: Secondary | ICD-10-CM | POA: Diagnosis not present

## 2021-12-23 DIAGNOSIS — Z3687 Encounter for antenatal screening for uncertain dates: Secondary | ICD-10-CM | POA: Diagnosis not present

## 2021-12-23 DIAGNOSIS — Z36 Encounter for antenatal screening for chromosomal anomalies: Secondary | ICD-10-CM | POA: Diagnosis not present

## 2021-12-23 DIAGNOSIS — Z3482 Encounter for supervision of other normal pregnancy, second trimester: Secondary | ICD-10-CM | POA: Diagnosis not present

## 2022-08-23 ENCOUNTER — Encounter: Payer: Self-pay | Admitting: *Deleted

## 2022-10-05 DIAGNOSIS — R079 Chest pain, unspecified: Secondary | ICD-10-CM | POA: Diagnosis not present

## 2022-12-31 DIAGNOSIS — R87618 Other abnormal cytological findings on specimens from cervix uteri: Secondary | ICD-10-CM | POA: Diagnosis not present

## 2022-12-31 DIAGNOSIS — G478 Other sleep disorders: Secondary | ICD-10-CM | POA: Diagnosis not present

## 2022-12-31 DIAGNOSIS — Z72820 Sleep deprivation: Secondary | ICD-10-CM | POA: Diagnosis not present

## 2022-12-31 DIAGNOSIS — R5383 Other fatigue: Secondary | ICD-10-CM | POA: Diagnosis not present

## 2023-02-07 DIAGNOSIS — A749 Chlamydial infection, unspecified: Secondary | ICD-10-CM | POA: Diagnosis not present

## 2023-02-07 DIAGNOSIS — B3731 Acute candidiasis of vulva and vagina: Secondary | ICD-10-CM | POA: Diagnosis not present

## 2023-05-24 DIAGNOSIS — R197 Diarrhea, unspecified: Secondary | ICD-10-CM | POA: Diagnosis not present

## 2023-06-13 DIAGNOSIS — Z020 Encounter for examination for admission to educational institution: Secondary | ICD-10-CM | POA: Diagnosis not present

## 2023-07-21 DIAGNOSIS — G43009 Migraine without aura, not intractable, without status migrainosus: Secondary | ICD-10-CM | POA: Diagnosis not present

## 2023-07-21 DIAGNOSIS — Z23 Encounter for immunization: Secondary | ICD-10-CM | POA: Diagnosis not present

## 2023-08-30 DIAGNOSIS — Z111 Encounter for screening for respiratory tuberculosis: Secondary | ICD-10-CM | POA: Diagnosis not present

## 2023-08-30 DIAGNOSIS — Z23 Encounter for immunization: Secondary | ICD-10-CM | POA: Diagnosis not present

## 2023-08-30 DIAGNOSIS — G43009 Migraine without aura, not intractable, without status migrainosus: Secondary | ICD-10-CM | POA: Diagnosis not present

## 2023-09-18 ENCOUNTER — Emergency Department (HOSPITAL_COMMUNITY): Payer: PRIVATE HEALTH INSURANCE

## 2023-09-18 ENCOUNTER — Emergency Department (HOSPITAL_COMMUNITY)
Admission: EM | Admit: 2023-09-18 | Discharge: 2023-09-18 | Disposition: A | Discharge status: Home / Self Care | Payer: PRIVATE HEALTH INSURANCE | Attending: Emergency Medicine | Admitting: Emergency Medicine

## 2023-09-18 ENCOUNTER — Encounter (HOSPITAL_COMMUNITY): Payer: Self-pay

## 2023-09-18 ENCOUNTER — Other Ambulatory Visit: Payer: Self-pay

## 2023-09-18 DIAGNOSIS — O209 Hemorrhage in early pregnancy, unspecified: Secondary | ICD-10-CM | POA: Diagnosis present

## 2023-09-18 DIAGNOSIS — Z3A01 Less than 8 weeks gestation of pregnancy: Secondary | ICD-10-CM | POA: Diagnosis not present

## 2023-09-18 DIAGNOSIS — O4690 Antepartum hemorrhage, unspecified, unspecified trimester: Secondary | ICD-10-CM

## 2023-09-18 LAB — CBC WITH DIFFERENTIAL/PLATELET
Abs Immature Granulocytes: 0.02 10*3/uL (ref 0.00–0.07)
Basophils Absolute: 0 10*3/uL (ref 0.0–0.1)
Basophils Relative: 1 %
Eosinophils Absolute: 0.2 10*3/uL (ref 0.0–0.5)
Eosinophils Relative: 2 %
HCT: 38.4 % (ref 36.0–46.0)
Hemoglobin: 11.8 g/dL — ABNORMAL LOW (ref 12.0–15.0)
Immature Granulocytes: 0 %
Lymphocytes Relative: 20 %
Lymphs Abs: 1.5 10*3/uL (ref 0.7–4.0)
MCH: 27.1 pg (ref 26.0–34.0)
MCHC: 30.7 g/dL (ref 30.0–36.0)
MCV: 88.3 fL (ref 80.0–100.0)
Monocytes Absolute: 0.5 10*3/uL (ref 0.1–1.0)
Monocytes Relative: 7 %
Neutro Abs: 5.1 10*3/uL (ref 1.7–7.7)
Neutrophils Relative %: 70 %
Platelets: 222 10*3/uL (ref 150–400)
RBC: 4.35 MIL/uL (ref 3.87–5.11)
RDW: 14.6 % (ref 11.5–15.5)
WBC: 7.3 10*3/uL (ref 4.0–10.5)
nRBC: 0 % (ref 0.0–0.2)

## 2023-09-18 LAB — HCG, QUANTITATIVE, PREGNANCY: hCG, Beta Chain, Quant, S: 1034 m[IU]/mL — ABNORMAL HIGH (ref <5)

## 2023-09-18 MED ORDER — CEPHALEXIN 500 MG PO CAPS: 500.0000 mg | ORAL_CAPSULE | Freq: Two times a day (BID) | ORAL | 0 refills | Status: DC

## 2023-09-18 NOTE — ED Provider Notes (Signed)
[place] EMERGENCY DEPARTMENT AT [facility] Provider Note   CSN: [id number] Arrival date & time: 09/18/23  0036     History  Chief Complaint  Patient presents with   Vaginal Bleeding    Barbara Long is a 22 y.o. female.  22 year old female presents the emergency room with concern for vaginal bleeding in early pregnancy.  Reports positive home pregnancy test this week, noted vaginal bleeding while eating dinner.  This pregnancy would make patient G5 P2 (ab x 2). Lower abdominal cramping, mild. No other complaints or concerns. Has used 1 pad tonight.        Home Medications Prior to Admission medications   Medication Sig Start Date End Date Taking? Authorizing Provider  fluticasone (FLONASE) 50 MCG/ACT nasal spray Place 1 spray into both nostrils in the morning and at bedtime. Patient not taking: Reported on 09/18/2023 01/14/21   Particia Nearing, PA-C  Fremanezumab-vfrm (AJOVY) 225 MG/1.5ML SOAJ Inject 675 mg into the skin every 3 (three) months. Patient not taking: Reported on 09/18/2023 08/22/20   Glyn Ade, Scot Jun, PA-C  ibuprofen (ADVIL) 600 MG tablet Take 1 tablet (600 mg total) by mouth every 6 (six) hours as needed. Patient not taking: Reported on 09/18/2023 08/17/19   Glyn Ade, Scot Jun, PA-C  medroxyPROGESTERone (DEPO-PROVERA) 150 MG/ML injection Inject 1 mL (150 mg total) into the muscle every 3 (three) months. Patient not taking: Reported on 09/18/2023 01/15/20   Hurshel Party, CNM  promethazine-dextromethorphan (PROMETHAZINE-DM) 6.25-15 MG/5ML syrup Take 5 mLs by mouth 4 (four) times daily as needed for cough. Patient not taking: Reported on 09/18/2023 01/14/21   Particia Nearing, PA-C  propranolol (INDERAL) 40 MG tablet Take 40 mg by mouth at bedtime. Patient not taking: Reported on 09/18/2023 08/30/23 02/26/24  [provider]  SUMAtriptan (IMITREX) 50 MG tablet Take 50 mg by mouth every 2 (two) hours as  needed. Patient not taking: Reported on 09/18/2023    [provider]  Ubrogepant (UBRELVY) 100 MG TABS Take 100 mg by mouth as needed. Patient not taking: Reported on 09/18/2023 08/22/20   Glyn Ade, Scot Jun, PA-C      Allergies    Patient has no known allergies.    Review of Systems   Review of Systems Negative except as per HPI Physical Exam Updated Vital Signs BP (!) 151/93 (BP Location: Right Arm)   Pulse 83   Temp 97.7 F (36.5 C) (Oral)   Resp 18   Ht 5\' 5"  (1.651 m)   Wt 81.2 kg   SpO2 99%   BMI 29.79 kg/m  Physical Exam Vitals and nursing note reviewed.  Constitutional:      General: She is not in acute distress.    Appearance: She is well-developed. She is not diaphoretic.  HENT:     Head: Normocephalic and atraumatic.  Pulmonary:     Effort: Pulmonary effort is normal.  Abdominal:     Palpations: Abdomen is soft.     Tenderness: There is no abdominal tenderness.  Skin:    General: Skin is warm and dry.     Findings: No erythema or rash.  Neurological:     Mental Status: She is alert and oriented to person, place, and time.  Psychiatric:        Behavior: Behavior normal.     ED Results / Procedures / Treatments   Labs (all labs ordered are listed, but only abnormal results are displayed) Labs Reviewed  CBC WITH DIFFERENTIAL/PLATELET -  Abnormal; Notable for the following components:      Result Value   Hemoglobin 11.8 (*)    All other components within normal limits  HCG, QUANTITATIVE, PREGNANCY - Abnormal; Notable for the following components:   hCG, Beta Chain, Quant, S 1,034 (*)    All other components within normal limits    EKG None  Radiology US OB Comp < 14 Wks  Result Date: 09/18/2023 CLINICAL DATA:  Vaginal bleeding and first-trimester pregnancy EXAM: OBSTETRIC <14 WK [place] AND TRANSVAGINAL OB US TECHNIQUE: Both transabdominal and transvaginal ultrasound examinations were performed for complete evaluation of the gestation as  well as the maternal uterus, adnexal regions, and pelvic cul-de-sac. Transvaginal technique was performed to assess early pregnancy. COMPARISON:  12/16/2017 FINDINGS: Intrauterine gestational sac: Likely present and single Yolk sac:  Not Visualized. MSD: 4.6 mm   5 w   2 d Subchorionic hemorrhage:  None visualized. Maternal uterus/adnexae: Corpus luteum on the right. No pelvic fluid IMPRESSION: Probable early intrauterine gestational sac, but no yolk sac or fetal pole. Recommend follow-up quantitative B-HCG levels and follow-up US in 14 days to assess viability. This recommendation follows SRU consensus guidelines: Diagnostic Criteria for Nonviable Pregnancy Early in the First Trimester. Malva Limes Med 2013; 952:8413-24. Electronically Signed   By: Tiburcio Pea M.D.   On: 09/18/2023 04:06   US OB Transvaginal  Result Date: 09/18/2023 CLINICAL DATA:  Vaginal bleeding and first-trimester pregnancy EXAM: OBSTETRIC <14 WK [place] AND TRANSVAGINAL OB US TECHNIQUE: Both transabdominal and transvaginal ultrasound examinations were performed for complete evaluation of the gestation as well as the maternal uterus, adnexal regions, and pelvic cul-de-sac. Transvaginal technique was performed to assess early pregnancy. COMPARISON:  12/16/2017 FINDINGS: Intrauterine gestational sac: Likely present and single Yolk sac:  Not Visualized. MSD: 4.6 mm   5 w   2 d Subchorionic hemorrhage:  None visualized. Maternal uterus/adnexae: Corpus luteum on the right. No pelvic fluid IMPRESSION: Probable early intrauterine gestational sac, but no yolk sac or fetal pole. Recommend follow-up quantitative B-HCG levels and follow-up US in 14 days to assess viability. This recommendation follows SRU consensus guidelines: Diagnostic Criteria for Nonviable Pregnancy Early in the First Trimester. Malva Limes Med 2013; 401:0272-53. Electronically Signed   By: Tiburcio Pea M.D.   On: 09/18/2023 04:06    Procedures Procedures    Medications Ordered  in ED Medications - No data to display  ED Course/ Medical Decision Making/ A&P                                 Medical Decision Making Amount and/or Complexity of Data Reviewed Labs: ordered. Radiology: ordered.   This patient presents to the ED for concern of vaginal bleeding in early pregnancy, this involves an extensive number of treatment options, and is a complaint that carries with it a high risk of complications and morbidity.  The differential diagnosis includes subchorionic hemorrhage, IUP, ectobic, missed ab, complete ab   Co morbidities that complicate the patient evaluation  No significant past medical history    Additional history obtained:  External records from outside source obtained and reviewed including ABO O positive 04/23/2018, 12/07/2017   Lab Tests:  I Ordered, and personally interpreted labs.  The pertinent results include:  hcg elevated at 1,034. CBC with Hgb 11.8.    Imaging Studies ordered:  I ordered imaging studies including OB US  I independently visualized and interpreted imaging  which showed likely early IUP I agree with the radiologist interpretation   Problem List / ED Course / Critical interventions / Medication management  23 year old female with concern for vaginal bleeding in early pregnancy after positive home test, has not established OB care.  Abdomen is soft and nontender.  Hemoglobin hematocrit within normal notes.  hCG is elevated minimally at 1,304.  Ultrasound with possible developing IUP however early.  Results viewed with patient, discussed plan to follow-up with OB or [facility] in 48 hours for repeat quant.  Can return to any ER if needed.  Rh+, verified on prior records, not repeated tonight. I have reviewed the patients home medicines and have made adjustments as needed   Social Determinants of Health:  No  OB   Test / Admission - Considered:  Stable for dc with follow up with [facility]U for repeat quant          Final  Clinical Impression(s) / ED Diagnoses Final diagnoses:  Vaginal bleeding in pregnancy    Rx / DC Orders ED Discharge Orders          Ordered    cephALEXin (KEFLEX) 500 MG capsule  2 times daily,   Status:  Discontinued        09/18/23 0428              Jeannie Fend, PA-C 09/18/23 3557    Glynn Octave, MD 09/18/23 [phone]

## 2023-09-18 NOTE — Discharge Instructions (Addendum)
Prescription sent to your pharmacy by mistake. Do NOT take this prescription. Unfortunately, your pharmacy is closed and I am unable to cancel it.   Return to the ER- or go to [facility] and [place] for any fevers, worsening or concerning symptoms. Take antibiotics as prescribed and complete the full course. Recommend repeat HCG in 48 hours, this can be done at [facility].

## 2023-09-18 NOTE — ED Triage Notes (Addendum)
Went to use the restroom and noticed vaginal bleeding.   (+) home pregnancy test earlier this week.   2 previous births, 2 p[previous abortions.   Mild lower abdominal cramps.

## 2023-09-19 DIAGNOSIS — O209 Hemorrhage in early pregnancy, unspecified: Secondary | ICD-10-CM | POA: Diagnosis not present

## 2023-09-20 DIAGNOSIS — O209 Hemorrhage in early pregnancy, unspecified: Secondary | ICD-10-CM | POA: Diagnosis not present

## 2023-10-19 DIAGNOSIS — O039 Complete or unspecified spontaneous abortion without complication: Secondary | ICD-10-CM | POA: Diagnosis not present

## 2023-12-05 DIAGNOSIS — N898 Other specified noninflammatory disorders of vagina: Secondary | ICD-10-CM | POA: Diagnosis not present

## 2024-01-02 DIAGNOSIS — Z124 Encounter for screening for malignant neoplasm of cervix: Secondary | ICD-10-CM | POA: Diagnosis not present

## 2024-01-02 DIAGNOSIS — N926 Irregular menstruation, unspecified: Secondary | ICD-10-CM | POA: Diagnosis not present

## 2024-01-23 DIAGNOSIS — N912 Amenorrhea, unspecified: Secondary | ICD-10-CM | POA: Diagnosis not present

## 2024-01-23 DIAGNOSIS — Z3689 Encounter for other specified antenatal screening: Secondary | ICD-10-CM | POA: Diagnosis not present
